# Patient Record
Sex: Female | Born: 1975 | Race: Black or African American | Hispanic: No | Marital: Married | Smoking: Never smoker
Health system: Southern US, Community
[De-identification: ages and names within clinical notes are randomized; demographics above are authoritative.]

## PROBLEM LIST (undated history)

## (undated) HISTORY — PX: DILATION AND CURETTAGE OF UTERUS: SHX78

---

## 2009-04-13 ENCOUNTER — Ambulatory Visit: Payer: Self-pay | Admitting: Family Medicine

## 2009-07-05 ENCOUNTER — Ambulatory Visit: Payer: Self-pay | Admitting: Unknown Physician Specialty

## 2010-08-02 ENCOUNTER — Ambulatory Visit: Payer: Self-pay | Admitting: Family Medicine

## 2012-06-17 ENCOUNTER — Ambulatory Visit: Payer: Self-pay | Admitting: Family Medicine

## 2012-12-31 ENCOUNTER — Emergency Department: Payer: Self-pay | Admitting: Emergency Medicine

## 2012-12-31 LAB — URINALYSIS, COMPLETE
Bilirubin,UR: NEGATIVE
Nitrite: NEGATIVE
Protein: NEGATIVE
RBC,UR: 1 /HPF (ref 0–5)
Specific Gravity: 1.017 (ref 1.003–1.030)
WBC UR: 1 /HPF (ref 0–5)

## 2012-12-31 LAB — WET PREP, GENITAL

## 2012-12-31 LAB — COMPREHENSIVE METABOLIC PANEL
Alkaline Phosphatase: 68 U/L (ref 50–136)
Anion Gap: 6 — ABNORMAL LOW (ref 7–16)
BUN: 9 mg/dL (ref 7–18)
Bilirubin,Total: 0.4 mg/dL (ref 0.2–1.0)
Calcium, Total: 9 mg/dL (ref 8.5–10.1)
Chloride: 103 mmol/L (ref 98–107)
Creatinine: 0.92 mg/dL (ref 0.60–1.30)
EGFR (African American): >60
EGFR (Non-African Amer.): >60
Glucose: 98 mg/dL (ref 65–99)
Osmolality: 271 (ref 275–301)
SGOT(AST): 20 U/L (ref 15–37)
Total Protein: 7.4 g/dL (ref 6.4–8.2)

## 2012-12-31 LAB — CBC
HCT: 41.3 % (ref 35.0–47.0)
HGB: 13.7 g/dL (ref 12.0–16.0)
MCH: 27.5 pg (ref 26.0–34.0)
MCHC: 33.3 g/dL (ref 32.0–36.0)
MCV: 83 fL (ref 80–100)
Platelet: 256 10*3/uL (ref 150–440)
RDW: 15.3 % — ABNORMAL HIGH (ref 11.5–14.5)
WBC: 7.4 10*3/uL (ref 3.6–11.0)

## 2012-12-31 LAB — GC/CHLAMYDIA PROBE AMP

## 2012-12-31 LAB — HCG, QUANTITATIVE, PREGNANCY: Beta Hcg, Quant.: 276 m[IU]/mL — ABNORMAL HIGH

## 2012-12-31 LAB — PREGNANCY, URINE: Pregnancy Test, Urine: POSITIVE m[IU]/mL

## 2013-08-13 ENCOUNTER — Ambulatory Visit: Payer: Self-pay | Admitting: Family Medicine

## 2015-01-13 ENCOUNTER — Other Ambulatory Visit: Payer: Self-pay | Admitting: Family Medicine

## 2015-01-13 DIAGNOSIS — Z1231 Encounter for screening mammogram for malignant neoplasm of breast: Secondary | ICD-10-CM

## 2015-01-26 ENCOUNTER — Ambulatory Visit
Admission: RE | Admit: 2015-01-26 | Discharge: 2015-01-26 | Disposition: A | Discharge status: Home / Self Care | Payer: 59 | Source: Ambulatory Visit | Attending: Family Medicine | Admitting: Family Medicine

## 2015-01-26 DIAGNOSIS — Z1231 Encounter for screening mammogram for malignant neoplasm of breast: Secondary | ICD-10-CM | POA: Diagnosis present

## 2016-02-15 ENCOUNTER — Other Ambulatory Visit: Payer: Self-pay | Admitting: Family Medicine

## 2016-02-15 DIAGNOSIS — Z1231 Encounter for screening mammogram for malignant neoplasm of breast: Secondary | ICD-10-CM

## 2016-02-27 ENCOUNTER — Ambulatory Visit
Admission: RE | Admit: 2016-02-27 | Discharge: 2016-02-27 | Disposition: A | Discharge status: Home / Self Care | Payer: 59 | Source: Ambulatory Visit | Attending: Family Medicine | Admitting: Family Medicine

## 2016-02-27 DIAGNOSIS — Z1231 Encounter for screening mammogram for malignant neoplasm of breast: Secondary | ICD-10-CM | POA: Insufficient documentation

## 2016-05-29 ENCOUNTER — Telehealth: Payer: Self-pay

## 2016-05-29 NOTE — Telephone Encounter (Signed)
Pt called stating she needed her recs for another doctor.  Adv to fill out ROI.

## 2016-12-24 ENCOUNTER — Encounter (HOSPITAL_COMMUNITY): Payer: Self-pay | Admitting: *Deleted

## 2017-02-01 ENCOUNTER — Other Ambulatory Visit: Payer: Self-pay | Admitting: Family Medicine

## 2017-02-01 DIAGNOSIS — Z1231 Encounter for screening mammogram for malignant neoplasm of breast: Secondary | ICD-10-CM

## 2017-02-28 ENCOUNTER — Ambulatory Visit
Admission: RE | Admit: 2017-02-28 | Discharge: 2017-02-28 | Disposition: A | Discharge status: Home / Self Care | Payer: 59 | Source: Ambulatory Visit | Attending: Family Medicine | Admitting: Family Medicine

## 2017-02-28 DIAGNOSIS — Z1231 Encounter for screening mammogram for malignant neoplasm of breast: Secondary | ICD-10-CM | POA: Diagnosis not present

## 2017-03-01 ENCOUNTER — Other Ambulatory Visit: Payer: Self-pay | Admitting: Family Medicine

## 2017-03-01 DIAGNOSIS — R928 Other abnormal and inconclusive findings on diagnostic imaging of breast: Secondary | ICD-10-CM

## 2017-03-07 ENCOUNTER — Ambulatory Visit
Admission: RE | Admit: 2017-03-07 | Discharge: 2017-03-07 | Disposition: A | Discharge status: Home / Self Care | Payer: 59 | Source: Ambulatory Visit | Attending: Family Medicine | Admitting: Family Medicine

## 2017-03-07 DIAGNOSIS — N6489 Other specified disorders of breast: Secondary | ICD-10-CM | POA: Insufficient documentation

## 2017-03-07 DIAGNOSIS — R928 Other abnormal and inconclusive findings on diagnostic imaging of breast: Secondary | ICD-10-CM

## 2017-03-07 DIAGNOSIS — N6002 Solitary cyst of left breast: Secondary | ICD-10-CM | POA: Diagnosis not present

## 2017-07-31 ENCOUNTER — Other Ambulatory Visit: Payer: Self-pay | Admitting: Family Medicine

## 2017-07-31 DIAGNOSIS — N6002 Solitary cyst of left breast: Secondary | ICD-10-CM

## 2017-11-25 ENCOUNTER — Ambulatory Visit
Admission: RE | Admit: 2017-11-25 | Discharge: 2017-11-25 | Disposition: A | Discharge status: Home / Self Care | Payer: 59 | Source: Ambulatory Visit | Attending: Family Medicine | Admitting: Family Medicine

## 2017-11-25 DIAGNOSIS — N6002 Solitary cyst of left breast: Secondary | ICD-10-CM | POA: Diagnosis not present

## 2018-10-21 ENCOUNTER — Telehealth: Payer: Self-pay

## 2018-10-21 NOTE — Telephone Encounter (Signed)
Pt calling after hour nurse this am c/o rash on the outer area of vagina that has spread down to the inner thigh area now; rash is itchy.  [phone]  Adv pt I couldn't adv her b/c we haven't seen her in three yrs.  She needs to be seen.  While tx pt to [facility] for scheduling, I accendentally hung up on her.  Information given to SP who called pt back and scheduled her for tomorrow c PH.

## 2018-10-22 ENCOUNTER — Ambulatory Visit (INDEPENDENT_AMBULATORY_CARE_PROVIDER_SITE_OTHER): Payer: 59 | Admitting: Obstetrics & Gynecology

## 2018-10-22 ENCOUNTER — Other Ambulatory Visit (HOSPITAL_COMMUNITY)
Admission: RE | Admit: 2018-10-22 | Discharge: 2018-10-22 | Disposition: A | Discharge status: Home / Self Care | Payer: 59 | Source: Ambulatory Visit | Attending: Obstetrics & Gynecology | Admitting: Obstetrics & Gynecology

## 2018-10-22 ENCOUNTER — Encounter: Payer: Self-pay | Admitting: Obstetrics & Gynecology

## 2018-10-22 ENCOUNTER — Other Ambulatory Visit: Payer: Self-pay

## 2018-10-22 VITALS — BP 120/80 | Ht 67.0 in | Wt 252.0 lb

## 2018-10-22 DIAGNOSIS — N898 Other specified noninflammatory disorders of vagina: Secondary | ICD-10-CM | POA: Diagnosis not present

## 2018-10-22 DIAGNOSIS — B3731 Acute candidiasis of vulva and vagina: Secondary | ICD-10-CM

## 2018-10-22 DIAGNOSIS — B373 Candidiasis of vulva and vagina: Secondary | ICD-10-CM

## 2018-10-22 MED ORDER — CLOTRIMAZOLE-BETAMETHASONE 1-0.05 % EX CREA: 1.0000 "application " | TOPICAL_CREAM | Freq: Two times a day (BID) | CUTANEOUS | 0 refills | Status: DC

## 2018-10-22 NOTE — Patient Instructions (Signed)
Betamethasone; Clotrimazole skin cream What is this medicine? BETAMETHASONE; CLOTRIMAZOLE (bay ta METH a sone; kloe TRIM a zole) is a corticosteroid and antifungal cream. It treats ringworm and infections like jock itch and athlete's foot. It also helps reduce swelling, redness, and itching caused by these infections. This medicine may be used for other purposes; ask your health care provider or pharmacist if you have questions. COMMON BRAND NAME(S): Lotrisone What should I tell my health care provider before I take this medicine? They need to know if you have any of these conditions:  large areas of burned or damaged skin  skin thinning  peripheral vascular disease or poor circulation  an unusual or allergic reaction to betamethasone, clotrimazole, other corticosteroids, other antifungals, other medicines, foods, dyes, or preservatives  pregnant or trying to get pregnant  breast-feeding How should I use this medicine? This cream is for external use only. Do not take by mouth. Follow the directions on the prescription label. Wash your hands before and after use. If treating hand or nail infections, wash hands before use only. Apply a thin layer of cream to the affected area and rub in gently. Do not cover or wrap the treated area with an airtight bandage (like a plastic bandage). Use the cream for the full course of treatment prescribed, even if you think the condition is getting better. Use the medicine at regular intervals. Do not use more often than directed. Do not use on healthy skin or over large areas of skin. Do not use this medicine for any condition other than the one for which it was prescribed. When applying to the groin area, apply a small amount and do not use for longer than 2 weeks unless directed to by your doctor or health care professional. Do not get this cream in your eyes. If you do, rinse out with plenty of cool tap water. Talk to your pediatrician regarding the use of  this medicine in children. While this drug may be prescribed for children as young as 17 years for selected conditions, precautions do apply. Patients over 55 years old may have a stronger reaction and need a smaller dose. Overdosage: If you think you have taken too much of this medicine contact a poison control center or emergency room at once. NOTE: This medicine is only for you. Do not share this medicine with others. What if I miss a dose? If you miss a dose, use it as soon as you can. If it is almost time for your next dose, use only that dose. Do not use double or take extra doses. What may interact with this medicine?  topical products that have nystatin This list may not describe all possible interactions. Give your health care provider a list of all the medicines, herbs, non-prescription drugs, or dietary supplements you use. Also tell them if you smoke, drink alcohol, or use illegal drugs. Some items may interact with your medicine. What should I watch for while using this medicine? If using this medicine on your body or groin tell your doctor or health care professional if your symptoms do not improve within 1 week. If using this medicine on your feet tell your doctor or health care professional if your symptoms do not improve within 2 weeks. Tell your doctor if your skin infection returns after you stop using this cream. If you are using this cream for 'jock itch' be sure to dry the groin completely after bathing. Do not wear underwear that is tight-fitting or  made from synthetic fibers like rayon or nylon. Wear loose-fitting, cotton underwear. If you are using this cream for athlete's foot be sure to dry your feet carefully after bathing, especially between the toes. Do not wear socks made from wool or synthetic materials like rayon or nylon. Wear clean cotton socks and change them at least once a day, change them more if your feet sweat a lot. Also, try to wear sandals or shoes that are  well-ventilated. Do not use this cream to treat diaper rash. What side effects may I notice from receiving this medicine? Side effects that you should report to your doctor or health care professional as soon as possible:  allergic reactions like skin rash, itching or hives, swelling of the face, lips, or tongue  dark red spots on the skin  lack of healing of skin condition  loss of feeling on skin  painful, red, pus-filled blisters in hair follicles  skin infection  sores or blisters that do not heal properly  thinning of the skin or sunburn Side effects that usually do not require medical attention (report to your doctor or health care professional if they continue or are bothersome):  dry or peeling skin  minor skin irritation, burning, or itching This list may not describe all possible side effects. Call your doctor for medical advice about side effects. You may report side effects to FDA at 1-800-FDA-1088. Where should I keep my medicine? Keep out of the reach of children. Store at room temperature between 15 and 30 degrees C ( 59 and 86 degrees F). Do not freeze. Throw away any unused medicine after the expiration date. NOTE: This sheet is a summary. It may not cover all possible information. If you have questions about this medicine, talk to your doctor, pharmacist, or health care provider.  2020 Elsevier/Gold Standard (2007-06-18 16:14:28)

## 2018-10-22 NOTE — Progress Notes (Signed)
HPI:      Susan Gardner is a 43 y.o. [phone] F, Patient's last menstrual period was 10/12/2018., presents today for a problem visit.  Susan Gardner complains of:  Vulvar concern:   This is a 43 y.o. old Caucasian/White female who presents for the evaluation of vulvar lesion(s). Susan Gardner describes the vulvar lesion(s) as an itchy raised fine rash with some swelling; itching is the worst of it.  Used Monistat externally for 2 days and seemed to help, then came back.  Has reg periods, started having sx's after recent period stopped.  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  No pain, bumps.  Also reports freq vaginal discharge without odor or other assoc sx;s.  No contraception used.  Has tried for pregnancy without success in recent times; OK if it happened.  Has 30 yo son.  Recent miscarriages.  Reg cycles.  PAP UTD.  PMHx: Susan Gardner  has no past medical history on file. Also,  has no past surgical history on file., family history includes Breast cancer (age of onset: 42) in her mother.,  reports that Susan Gardner has never smoked. Susan Gardner has never used smokeless tobacco. Susan Gardner reports current alcohol use. Susan Gardner reports that Susan Gardner does not use drugs.  Susan Gardner has a current medication list which includes the following prescription(s): clotrimazole-betamethasone. Also, has No Known Allergies.  Review of Systems  Constitutional: Negative for chills, fever and malaise/fatigue.  HENT: Negative for congestion, sinus pain and sore throat.   Eyes: Negative for blurred vision and pain.  Respiratory: Negative for cough and wheezing.   Cardiovascular: Negative for chest pain and leg swelling.  Gastrointestinal: Negative for abdominal pain, constipation, diarrhea, heartburn, nausea and vomiting.  Genitourinary: Negative for dysuria, frequency, hematuria and urgency.  Musculoskeletal: Negative for back pain, joint pain, myalgias and neck pain.  Skin: Negative for itching and rash.  Neurological: Negative for dizziness, tremors and  weakness.  Endo/Heme/Allergies: Does not bruise/bleed easily.  Psychiatric/Behavioral: Negative for depression. The patient is not nervous/anxious and does not have insomnia.     Objective: BP 120/80   Ht 5\' 7"  (1.702 m)   Wt 252 lb (114.3 kg)   LMP 10/12/2018   BMI 39.47 kg/m  Physical Exam Constitutional:      General: Susan Gardner is not in acute distress.    Appearance: Susan Gardner is well-developed.  Genitourinary:     Pelvic exam was performed with patient supine.     Vagina and uterus normal.     No vaginal erythema or bleeding.     No cervical motion tenderness, discharge, polyp or nabothian cyst.     Uterus is mobile.     Uterus is not enlarged.     No uterine mass detected.    Uterus is midaxial.     No right or left adnexal mass present.     Right adnexa not tender.     Left adnexa not tender.     Genitourinary Comments: Maculo papular eruption with scratching evidence No vesicles, ulcers  HENT:     Head: Normocephalic and atraumatic.     Nose: Nose normal.  Abdominal:     General: There is no distension.     Palpations: Abdomen is soft.     Tenderness: There is no abdominal tenderness.  Musculoskeletal: Normal range of motion.  Neurological:     Mental Status: Susan Gardner is alert and oriented to person, place, and time.     Cranial Nerves: No cranial nerve deficit.  Skin:  General: Skin is warm and dry.     ASSESSMENT/PLAN:  New onset vulvar concern  Problem List Items Addressed This Visit      Genitourinary   Candidal vulvitis - Primary   Relevant Medications   clotrimazole-betamethasone (LOTRISONE) cream    Other Visit Diagnoses    Vaginal discharge       Relevant Orders   Cervicovaginal ancillary only    Consider Diflucan for recurrence, also Terazol if internal  PAP at PCP 2019, UTD, never abnormal MMG 2019, UTD  Barnett Applebaum, MD, [facility], [facility] 10/22/2018  1:59 PM

## 2018-10-24 ENCOUNTER — Other Ambulatory Visit: Payer: Self-pay | Admitting: Obstetrics & Gynecology

## 2018-10-24 LAB — CERVICOVAGINAL ANCILLARY ONLY
Bacterial vaginitis: POSITIVE — AB
Candida vaginitis: NEGATIVE

## 2018-10-24 MED ORDER — METRONIDAZOLE 500 MG PO TABS: 500.0000 mg | ORAL_TABLET | Freq: Two times a day (BID) | ORAL | 0 refills | Status: DC

## 2018-10-24 NOTE — Progress Notes (Signed)
Pt aware.

## 2018-10-24 NOTE — Progress Notes (Signed)
Let her know culture pos for bacterial vaginitis and we will call in eRx for medicine for this (Flagyl twice daily for a week)

## 2018-12-03 ENCOUNTER — Encounter: Payer: Self-pay | Admitting: Obstetrics & Gynecology

## 2018-12-19 ENCOUNTER — Other Ambulatory Visit: Payer: Self-pay | Admitting: Family Medicine

## 2018-12-19 DIAGNOSIS — Z1231 Encounter for screening mammogram for malignant neoplasm of breast: Secondary | ICD-10-CM

## 2019-01-15 ENCOUNTER — Other Ambulatory Visit: Payer: Self-pay

## 2019-01-15 DIAGNOSIS — Z20822 Contact with and (suspected) exposure to covid-19: Secondary | ICD-10-CM

## 2019-01-17 LAB — NOVEL CORONAVIRUS, NAA: SARS-CoV-2, NAA: NOT DETECTED

## 2020-09-16 ENCOUNTER — Other Ambulatory Visit: Payer: Self-pay | Admitting: Family Medicine

## 2020-09-16 DIAGNOSIS — Z1231 Encounter for screening mammogram for malignant neoplasm of breast: Secondary | ICD-10-CM

## 2020-09-20 ENCOUNTER — Ambulatory Visit
Admission: RE | Admit: 2020-09-20 | Discharge: 2020-09-20 | Disposition: A | Discharge status: Home / Self Care | Payer: BC Managed Care – PPO | Source: Ambulatory Visit | Attending: Family Medicine | Admitting: Family Medicine

## 2020-09-20 ENCOUNTER — Other Ambulatory Visit: Payer: Self-pay

## 2020-09-20 DIAGNOSIS — Z1231 Encounter for screening mammogram for malignant neoplasm of breast: Secondary | ICD-10-CM | POA: Insufficient documentation

## 2020-11-02 ENCOUNTER — Ambulatory Visit: Payer: 59 | Admitting: Obstetrics & Gynecology

## 2020-11-24 ENCOUNTER — Ambulatory Visit: Payer: Self-pay | Admitting: Obstetrics & Gynecology

## 2020-11-25 ENCOUNTER — Other Ambulatory Visit (HOSPITAL_COMMUNITY)
Admission: RE | Admit: 2020-11-25 | Discharge: 2020-11-25 | Disposition: A | Discharge status: Home / Self Care | Payer: BC Managed Care – PPO | Source: Ambulatory Visit | Attending: Obstetrics & Gynecology | Admitting: Obstetrics & Gynecology

## 2020-11-25 ENCOUNTER — Ambulatory Visit (INDEPENDENT_AMBULATORY_CARE_PROVIDER_SITE_OTHER): Payer: BC Managed Care – PPO | Admitting: Obstetrics & Gynecology

## 2020-11-25 ENCOUNTER — Encounter: Payer: Self-pay | Admitting: Obstetrics & Gynecology

## 2020-11-25 ENCOUNTER — Other Ambulatory Visit: Payer: Self-pay

## 2020-11-25 VITALS — BP 120/80 | Ht 67.0 in | Wt 258.0 lb

## 2020-11-25 DIAGNOSIS — Z1211 Encounter for screening for malignant neoplasm of colon: Secondary | ICD-10-CM | POA: Diagnosis not present

## 2020-11-25 DIAGNOSIS — Z124 Encounter for screening for malignant neoplasm of cervix: Secondary | ICD-10-CM | POA: Insufficient documentation

## 2020-11-25 DIAGNOSIS — Z01419 Encounter for gynecological examination (general) (routine) without abnormal findings: Secondary | ICD-10-CM

## 2020-11-25 NOTE — Progress Notes (Signed)
HPI:      [name] is a 45 y.o. [phone] who LMP was Patient's last menstrual period was 10/31/2020., she presents today for her annual examination. The patient has no complaints today.  Still has reg cycles; no s/sx menopause. The patient is sexually active. Her last pap: was normal and last mammogram: approximate date 2022 and was normal. The patient does perform self breast exams.  There is no notable family history of breast or ovarian cancer in her family.  The patient has regular exercise: yes.  The patient denies current symptoms of depression.    GYN History: Contraception: none  PMHx: History reviewed. No pertinent past medical history. Past Surgical History:  Procedure Laterality Date   DILATION AND CURETTAGE OF UTERUS     Family History  Problem Relation Age of Onset   Breast cancer Mother 28   Social History   Tobacco Use   Smoking status: Never   Smokeless tobacco: Never  Vaping Use   Vaping Use: Never used  Substance Use Topics   Alcohol use: Yes   Drug use: Never   No current outpatient medications on file. Allergies: Patient has no known allergies.  Review of Systems  Constitutional:  Negative for chills, fever and malaise/fatigue.  HENT:  Negative for congestion, sinus pain and sore throat.   Eyes:  Negative for blurred vision and pain.  Respiratory:  Negative for cough and wheezing.   Cardiovascular:  Negative for chest pain and leg swelling.  Gastrointestinal:  Negative for abdominal pain, constipation, diarrhea, heartburn, nausea and vomiting.  Genitourinary:  Negative for dysuria, frequency, hematuria and urgency.  Musculoskeletal:  Negative for back pain, joint pain, myalgias and neck pain.  Skin:  Negative for itching and rash.  Neurological:  Negative for dizziness, tremors and weakness.  Endo/Heme/Allergies:  Does not bruise/bleed easily.  Psychiatric/Behavioral:  Negative for depression. The patient is not nervous/anxious and does not  have insomnia.   All other systems reviewed and are negative.  Objective: BP 120/80   Ht '5\' 7"'$  (1.702 m)   Wt 258 lb (117 kg)   LMP 10/31/2020   BMI 40.41 kg/m   Filed Weights   11/25/20 1438  Weight: 258 lb (117 kg)   Body mass index is 40.41 kg/m. Physical Exam Constitutional:      General: She is not in acute distress.    Appearance: She is well-developed.  Genitourinary:     Bladder, rectum and urethral meatus normal.     No lesions in the vagina.     Right Labia: No rash, tenderness or lesions.    Left Labia: No tenderness, lesions or rash.    No vaginal bleeding.      Right Adnexa: not tender and no mass present.    Left Adnexa: not tender and no mass present.    No cervical motion tenderness, friability, lesion or polyp.     Uterus is not enlarged.     No uterine mass detected.    Pelvic exam was performed with patient in the lithotomy position.  Breasts:    Right: No mass, skin change or tenderness.     Left: No mass, skin change or tenderness.  HENT:     Head: Normocephalic and atraumatic. No laceration.     Right Ear: Hearing normal.     Left Ear: Hearing normal.     Mouth/Throat:     Pharynx: Uvula midline.  Eyes:     Pupils: Pupils are equal, round,  and reactive to light.  Neck:     Thyroid: No thyromegaly.  Cardiovascular:     Rate and Rhythm: Normal rate and regular rhythm.     Heart sounds: No murmur heard.   No friction rub. No gallop.  Pulmonary:     Effort: Pulmonary effort is normal. No respiratory distress.     Breath sounds: Normal breath sounds. No wheezing.  Abdominal:     General: Bowel sounds are normal. There is no distension.     Palpations: Abdomen is soft.     Tenderness: There is no abdominal tenderness. There is no rebound.  Musculoskeletal:        General: Normal range of motion.     Cervical back: Normal range of motion and neck supple.  Neurological:     Mental Status: She is alert and oriented to person, place, and time.      Cranial Nerves: No cranial nerve deficit.  Skin:    General: Skin is warm and dry.  Psychiatric:        Judgment: Judgment normal.  Vitals reviewed.    Assessment:  ANNUAL EXAM 1. Women's annual routine gynecological examination   2. Screening for malignant neoplasm of cervix   3. Screen for colon cancer      Screening Plan:            1.  Cervical Screening-  Pap smear done today  2. Breast screening- Exam annually and mammogram>40 planned   3. Colonoscopy every 10 years, Hemoccult testing - after age 10  4. Labs managed by PCP  5. Counseling for contraception: no method, Infertility, will let it happen if happens    F/U  Return in about 1 year (around 11/25/2021) for Annual.  Barnett Applebaum, MD, [facility], [facility] 11/25/2020  3:11 PM

## 2020-11-25 NOTE — Patient Instructions (Signed)
PAP every three years Mammogram every year    Call [phone] to schedule at [facility] Colonoscopy every 10 years Labs yearly (with PCP)  Thank you for choosing [facility]. As part of our ongoing efforts to improve patient experience, we would appreciate your feedback. Please fill out the short survey that you will receive by mail or MyChart. Your opinion is important to [place]! - Dr. Kenton Kingfisher

## 2020-11-29 ENCOUNTER — Other Ambulatory Visit (INDEPENDENT_AMBULATORY_CARE_PROVIDER_SITE_OTHER): Payer: Self-pay

## 2020-11-29 DIAGNOSIS — Z1211 Encounter for screening for malignant neoplasm of colon: Secondary | ICD-10-CM

## 2020-11-29 LAB — CYTOLOGY - PAP
Comment: NEGATIVE
Diagnosis: NEGATIVE
High risk HPV: NEGATIVE

## 2020-11-29 MED ORDER — PEG 3350-KCL-NA BICARB-NACL 420 G PO SOLR: 4000.0000 mL | Freq: Once | ORAL | 0 refills | Status: AC

## 2020-11-29 NOTE — Progress Notes (Signed)
Gastroenterology Pre-Procedure Review  Request Date: 12/30/20 Requesting Physician: Dr. Bonna Gains  PATIENT REVIEW QUESTIONS: The patient responded to the following health history questions as indicated:    1. Are you having any GI issues? no 2. Do you have a personal history of Polyps? no 3. Do you have a family history of Colon Cancer or Polyps? yes (Mother colon polyps) 4. Diabetes Mellitus? no 5. Joint replacements in the past 12 months?no 6. Major health problems in the past 3 months?no 7. Any artificial heart valves, MVP, or defibrillator?no    MEDICATIONS & ALLERGIES:    Patient reports the following regarding taking any anticoagulation/antiplatelet therapy:   Plavix, Coumadin, Eliquis, Xarelto, Lovenox, Pradaxa, Brilinta, or Effient? no Aspirin? no  Patient confirms/reports the following medications:  Current Outpatient Medications  Medication Sig Dispense Refill   polyethylene glycol-electrolytes (NULYTELY) 420 g solution Take 4,000 mLs by mouth once for 1 dose. At 5 pm the evening before procedure: Drink an 8 oz glass every 20-30 minutes until completed. 4000 mL 0   Vitamin D, Ergocalciferol, (DRISDOL) 1.25 MG (50000 UNIT) CAPS capsule Take 50,000 Units by mouth once a week.     No current facility-administered medications for this visit.    Patient confirms/reports the following allergies:  No Known Allergies  No orders of the defined types were placed in this encounter.   AUTHORIZATION INFORMATION Primary Insurance: 1D#: Group #:  Secondary Insurance: 1D#: Group #:  SCHEDULE INFORMATION: Date: 12/30/20 Time: Location: [place]

## 2020-12-29 ENCOUNTER — Encounter: Payer: Self-pay | Admitting: Gastroenterology

## 2020-12-30 ENCOUNTER — Ambulatory Visit
Admission: RE | Admit: 2020-12-30 | Discharge: 2020-12-30 | Disposition: A | Discharge status: Home / Self Care | Payer: BC Managed Care – PPO | Attending: Gastroenterology | Admitting: Gastroenterology

## 2020-12-30 ENCOUNTER — Ambulatory Visit: Payer: BC Managed Care – PPO | Admitting: Anesthesiology

## 2020-12-30 ENCOUNTER — Encounter
Admission: RE | Disposition: A | Discharge status: Home / Self Care | Payer: Self-pay | Source: Home / Self Care | Attending: Gastroenterology

## 2020-12-30 DIAGNOSIS — Z1211 Encounter for screening for malignant neoplasm of colon: Secondary | ICD-10-CM | POA: Diagnosis present

## 2020-12-30 DIAGNOSIS — K635 Polyp of colon: Secondary | ICD-10-CM

## 2020-12-30 DIAGNOSIS — K5730 Diverticulosis of large intestine without perforation or abscess without bleeding: Secondary | ICD-10-CM | POA: Insufficient documentation

## 2020-12-30 DIAGNOSIS — D122 Benign neoplasm of ascending colon: Secondary | ICD-10-CM | POA: Insufficient documentation

## 2020-12-30 HISTORY — PX: COLONOSCOPY WITH PROPOFOL: SHX5780

## 2020-12-30 LAB — POCT PREGNANCY, URINE: Preg Test, Ur: NEGATIVE

## 2020-12-30 SURGERY — COLONOSCOPY WITH PROPOFOL
Anesthesia: General

## 2020-12-30 MED ORDER — PROPOFOL 10 MG/ML IV BOLUS
INTRAVENOUS | Status: DC | PRN
  Administered 2020-12-30: 50 mg via INTRAVENOUS
  Administered 2020-12-30: 30 mg via INTRAVENOUS
  Administered 2020-12-30: 50 mg via INTRAVENOUS
  Administered 2020-12-30: 150 mg via INTRAVENOUS

## 2020-12-30 MED ORDER — PROPOFOL 500 MG/50ML IV EMUL
INTRAVENOUS | Status: DC | PRN
  Administered 2020-12-30: 125 ug/kg/min via INTRAVENOUS

## 2020-12-30 MED ORDER — SODIUM CHLORIDE 0.9 % IV SOLN
INTRAVENOUS | Status: DC
  Administered 2020-12-30: 20 mL/h via INTRAVENOUS

## 2020-12-30 MED ORDER — PROPOFOL 500 MG/50ML IV EMUL
INTRAVENOUS | Status: AC
  Filled 2020-12-30: qty 50

## 2020-12-30 NOTE — Anesthesia Procedure Notes (Signed)
Date/Time: 12/30/2020 10:45 AM Performed by: Doreen Salvage, CRNA Pre-anesthesia Checklist: Patient identified, Emergency Drugs available, Suction available and Patient being monitored Patient Re-evaluated:Patient Re-evaluated prior to induction Oxygen Delivery Method: Supernova nasal CPAP Induction Type: IV induction Dental Injury: Teeth and Oropharynx as per pre-operative assessment  Comments: Nasal cannula with etCO2 monitoring

## 2020-12-30 NOTE — Anesthesia Postprocedure Evaluation (Signed)
Anesthesia Post Note  Patient: Susan Gardner  Procedure(s) Performed: COLONOSCOPY WITH PROPOFOL  Patient location during evaluation: Endoscopy Anesthesia Type: General Level of consciousness: awake and alert Pain management: pain level controlled Vital Signs Assessment: post-procedure vital signs reviewed and stable Respiratory status: spontaneous breathing, nonlabored ventilation and respiratory function stable Cardiovascular status: blood pressure returned to baseline and stable Postop Assessment: no apparent nausea or vomiting Anesthetic complications: no   No notable events documented.   Last Vitals:  Vitals:   12/30/20 1130 12/30/20 1140  BP: 116/72 126/85  Pulse: 80 83  Resp: 19 19  Temp:    SpO2: 100% 99%    Last Pain:  Vitals:   12/30/20 1140  TempSrc:   PainSc: 0-No pain                 [place] Ouch

## 2020-12-30 NOTE — Transfer of Care (Signed)
Immediate Anesthesia Transfer of Care Note  Patient: Susan Gardner  Procedure(s) Performed: COLONOSCOPY WITH PROPOFOL  Patient Location: PACU and Endoscopy Unit  Anesthesia Type:General  Level of Consciousness: awake, alert  and oriented  Airway & Oxygen Therapy: Patient Spontanous Breathing  Post-op Assessment: Report given to RN and Post -op Vital signs reviewed and stable  Post vital signs: Reviewed and stable  Last Vitals:  Vitals Value Taken Time  BP 112/77 12/30/20 1120  Temp 36.4 C 12/30/20 1120  Pulse 87 12/30/20 1121  Resp 16 12/30/20 1121  SpO2 100 % 12/30/20 1121  Vitals shown include unvalidated device data.  Last Pain:  Vitals:   12/30/20 1120  TempSrc: Temporal  PainSc: 0-No pain         Complications: No notable events documented.

## 2020-12-30 NOTE — Anesthesia Preprocedure Evaluation (Addendum)
Anesthesia Evaluation  Patient identified by MRN, date of birth, ID band Patient awake    Reviewed: Allergy & Precautions, NPO status , Patient's Chart, lab work & pertinent test results  Airway Mallampati: III  TM Distance: >3 FB Neck ROM: Full    Dental no notable dental hx.    Pulmonary neg pulmonary ROS,     + decreased breath sounds      Cardiovascular Exercise Tolerance: Good negative cardio ROS Normal cardiovascular exam     Neuro/Psych negative neurological ROS  negative psych ROS   GI/Hepatic negative GI ROS, Neg liver ROS,   Endo/Other  negative endocrine ROS  Renal/GU negative Renal ROS  negative genitourinary   Musculoskeletal negative musculoskeletal ROS (+)   Abdominal (+) + obese,   Peds negative pediatric ROS (+)  Hematology negative hematology ROS (+)   Anesthesia Other Findings Obesity  History reviewed. No pertinent past medical history.  Past Surgical History: No date: DILATION AND CURETTAGE OF UTERUS     Reproductive/Obstetrics negative OB ROS                            Anesthesia Physical Anesthesia Plan  ASA: 2  Anesthesia Plan: General   Post-op Pain Management:    Induction:   PONV Risk Score and Plan: Propofol infusion, TIVA and Treatment may vary due to age or medical condition  Airway Management Planned:   Additional Equipment:   Intra-op Plan:   Post-operative Plan:   Informed Consent: I have reviewed the patients History and Physical, chart, labs and discussed the procedure including the risks, benefits and alternatives for the proposed anesthesia with the patient or authorized representative who has indicated his/her understanding and acceptance.     Dental advisory given  Plan Discussed with: Anesthesiologist, CRNA and Surgeon  Anesthesia Plan Comments:        Anesthesia Quick Evaluation

## 2020-12-30 NOTE — H&P (Signed)
  Vonda Antigua, MD [address], [place], [place], [place], [address] [address], [place], [place], [place], [address] Phone: [phone]  Fax: [phone]  Primary Care Physician:  Marguerita Merles, MD   Pre-Procedure History & Physical: HPI:  Susan Gardner is a 45 y.o. female is here for a colonoscopy.   History reviewed. No pertinent past medical history.  Past Surgical History:  Procedure Laterality Date   DILATION AND CURETTAGE OF UTERUS      Prior to Admission medications   Medication Sig Start Date End Date Taking? Authorizing Provider  Vitamin D, Ergocalciferol, (DRISDOL) 1.25 MG (50000 UNIT) CAPS capsule Take 50,000 Units by mouth once a week. 09/15/20  Yes [provider]    Allergies as of 11/29/2020   (No Known Allergies)    Family History  Problem Relation Age of Onset   Breast cancer Mother 63    Social History   Socioeconomic History   Marital status: Married    Spouse name: Not on file   Number of children: Not on file   Years of education: Not on file   Highest education level: Not on file  Occupational History   Not on file  Tobacco Use   Smoking status: Never   Smokeless tobacco: Never  Vaping Use   Vaping Use: Never used  Substance and Sexual Activity   Alcohol use: Yes   Drug use: Never   Sexual activity: Yes    Birth control/protection: None  Other Topics Concern   Not on file  Social History Narrative   Not on file   Social Determinants of Health   Financial Resource Strain: Not on file  Food Insecurity: Not on file  Transportation Needs: Not on file  Physical Activity: Not on file  Stress: Not on file  Social Connections: Not on file  Intimate Partner Violence: Not on file    Review of Systems: See HPI, otherwise negative ROS  Physical Exam: Constitutional: General:   Alert,  Well-developed, well-nourished, pleasant and cooperative in NAD BP 131/83   Pulse 95   Temp 97.6 F (36.4 C) (Temporal)    Resp 20   Ht 5\' 7"  (1.702 m)   Wt 113.4 kg   SpO2 100%   BMI 39.16 kg/m   Head: Normocephalic, atraumatic.   Eyes:  Sclera clear, no icterus.   Conjunctiva pink.   Mouth:  No deformity or lesions, oropharynx pink & moist.  Neck:  Supple, trachea midline  Respiratory: Normal respiratory effort  Gastrointestinal:  Soft, non-tender and non-distended without masses, hepatosplenomegaly or hernias noted.  No guarding or rebound tenderness.     Cardiac: No clubbing or edema.  No cyanosis. Normal posterior tibial pedal pulses noted.  Lymphatic:  No significant cervical adenopathy.  Psych:  Alert and cooperative. Normal mood and affect.  Musculoskeletal:   Symmetrical without gross deformities. 5/5 Lower extremity strength bilaterally.  Skin: Warm. Intact without significant lesions or rashes. No jaundice.  Neurologic:  Face symmetrical, tongue midline, Normal sensation to touch;  grossly normal neurologically.  Psych:  Alert and oriented x3, Alert and cooperative. Normal mood and affect.  Impression/Plan: Susan Gardner is here for a colonoscopy to be performed for average risk screening.  Risks, benefits, limitations, and alternatives regarding  colonoscopy have been reviewed with the patient.  Questions have been answered.  All parties agreeable.   Virgel Manifold, MD  12/30/2020, 10:18 AM

## 2020-12-30 NOTE — Op Note (Signed)
[facility] Gastroenterology Patient Name: Susan Gardner Procedure Date: 12/30/2020 10:22 AM MRN: [id number] Account #: [id number] Date of Birth: July 15, 1975 Admit Type: Outpatient Age: 45 Room: [facility] ENDO ROOM 2 Gender: Female Note Status: Finalized Instrument Name: Jasper Riling [id number] Procedure:             Colonoscopy Indications:           Screening for colorectal malignant neoplasm Providers:             Grey Rakestraw B. Bonna Gains MD, MD Referring MD:          Marguerita Merles, MD (Referring MD) Medicines:             Monitored Anesthesia Care Complications:         No immediate complications. Procedure:             Pre-Anesthesia Assessment:                        - ASA Grade Assessment: II - A patient with mild                         systemic disease.                        - Prior to the procedure, a History and Physical was                         performed, and patient medications, allergies and                         sensitivities were reviewed. The patient's tolerance                         of previous anesthesia was reviewed.                        - The risks and benefits of the procedure and the                         sedation options and risks were discussed with the                         patient. All questions were answered and informed                         consent was obtained.                        - Patient identification and proposed procedure were                         verified prior to the procedure by the physician, the                         nurse, the anesthesiologist, the anesthetist and the                         technician. The procedure was verified in the  procedure room.                        After obtaining informed consent, the colonoscope was                         passed under direct vision. Throughout the procedure,                         the patient's blood pressure, pulse, and oxygen                          saturations were monitored continuously. The                         Colonoscope was introduced through the anus and                         advanced to the the cecum, identified by appendiceal                         orifice and ileocecal valve. The colonoscopy was                         performed with ease. The patient tolerated the                         procedure well. The quality of the bowel preparation                         was fair. Findings:      The perianal and digital rectal examinations were normal.      A 3 mm polyp was found in the ascending colon. The polyp was sessile.       The polyp was removed with a jumbo cold forceps. Resection and retrieval       were complete.      A 4 mm polyp was found in the sigmoid colon. The polyp was flat. The       polyp was removed with a jumbo cold forceps. Resection and retrieval       were complete.      A 6 mm polyp was found in the sigmoid colon. The polyp was sessile. The       polyp was removed with a cold snare. Resection and retrieval were       complete.      A 10 mm polyp was found in the sigmoid colon. The polyp was       pedunculated. The polyp was removed with a hot snare. Resection and       retrieval were complete. To prevent bleeding after the polypectomy, one       hemostatic clip was successfully placed. There was no bleeding at the       end of the procedure.      A few diverticula were found in the entire colon.      The exam was otherwise without abnormality.      The rectum, sigmoid colon, descending colon, transverse colon, ascending       colon and cecum appeared normal.      The retroflexed view of the distal rectum and anal verge was normal  and       showed no anal or rectal abnormalities. Impression:            - Preparation of the colon was fair.                        - One 3 mm polyp in the ascending colon, removed with                         a jumbo cold forceps. Resected and retrieved.                         - One 4 mm polyp in the sigmoid colon, removed with a                         jumbo cold forceps. Resected and retrieved.                        - One 6 mm polyp in the sigmoid colon, removed with a                         cold snare. Resected and retrieved.                        - One 10 mm polyp in the sigmoid colon, removed with a                         hot snare. Resected and retrieved. Clip was placed.                        - Diverticulosis in the entire examined colon.                        - The examination was otherwise normal.                        - The rectum, sigmoid colon, descending colon,                         transverse colon, ascending colon and cecum are normal.                        - The distal rectum and anal verge are normal on                         retroflexion view. Recommendation:        - Discharge patient to home (with escort).                        - Advance diet as tolerated.                        - Continue present medications.                        - Await pathology results.                        - Repeat colonoscopy date to be  determined after                         pending pathology results are reviewed.                        - The findings and recommendations were discussed with                         the patient.                        - The findings and recommendations were discussed with                         the patient's family.                        - Return to primary care physician as previously                         scheduled.                        - High fiber diet. Procedure Code(s):     --- Professional ---                        [phone], Colonoscopy, flexible; with removal of                         tumor(s), polyp(s), or other lesion(s) by snare                         technique                        45380, 69, Colonoscopy, flexible; with biopsy, single                         or  multiple Diagnosis Code(s):     --- Professional ---                        Z12.11, Encounter for screening for malignant neoplasm                         of colon                        K63.5, Polyp of colon CPT copyright 2019 [employer]. All rights reserved. The codes documented in this report are preliminary and upon coder review may  be revised to meet current compliance requirements.  Vonda Antigua, MD Margretta Sidle B. Bonna Gains MD, MD 12/30/2020 11:16:54 AM This report has been signed electronically. Number of Addenda: 0 Note Initiated On: 12/30/2020 10:22 AM Scope Withdrawal Time: 0 hours 17 minutes 54 seconds  Total Procedure Duration: 0 hours 27 minutes 26 seconds  Estimated Blood Loss:  Estimated blood loss: none.      [facility]

## 2021-01-02 ENCOUNTER — Encounter: Payer: Self-pay | Admitting: Gastroenterology

## 2021-01-02 LAB — SURGICAL PATHOLOGY

## 2021-01-05 ENCOUNTER — Encounter: Payer: Self-pay | Admitting: Gastroenterology

## 2021-10-06 ENCOUNTER — Other Ambulatory Visit: Payer: Self-pay | Admitting: Family Medicine

## 2021-10-06 DIAGNOSIS — Z1231 Encounter for screening mammogram for malignant neoplasm of breast: Secondary | ICD-10-CM

## 2021-10-11 ENCOUNTER — Ambulatory Visit
Admission: RE | Admit: 2021-10-11 | Discharge: 2021-10-11 | Disposition: A | Discharge status: Home / Self Care | Payer: BC Managed Care – PPO | Source: Ambulatory Visit | Attending: Family Medicine | Admitting: Family Medicine

## 2021-10-11 DIAGNOSIS — Z1231 Encounter for screening mammogram for malignant neoplasm of breast: Secondary | ICD-10-CM | POA: Diagnosis present

## 2022-01-23 ENCOUNTER — Emergency Department
Admission: EM | Admit: 2022-01-23 | Discharge: 2022-01-23 | Disposition: A | Discharge status: Home / Self Care | Payer: BC Managed Care – PPO | Attending: Emergency Medicine | Admitting: Emergency Medicine

## 2022-01-23 ENCOUNTER — Other Ambulatory Visit: Payer: Self-pay

## 2022-01-23 ENCOUNTER — Emergency Department: Payer: BC Managed Care – PPO

## 2022-01-23 DIAGNOSIS — M25571 Pain in right ankle and joints of right foot: Secondary | ICD-10-CM | POA: Diagnosis not present

## 2022-01-23 DIAGNOSIS — M79671 Pain in right foot: Secondary | ICD-10-CM | POA: Insufficient documentation

## 2022-01-23 MED ORDER — NAPROXEN 500 MG PO TABS: 500.0000 mg | ORAL_TABLET | Freq: Two times a day (BID) | ORAL | 2 refills | Status: AC

## 2022-01-23 MED ORDER — TRAMADOL HCL 50 MG PO TABS: 50.0000 mg | ORAL_TABLET | Freq: Four times a day (QID) | ORAL | 0 refills | Status: AC | PRN

## 2022-01-23 NOTE — ED Provider Notes (Signed)
   [facility] Provider Note    Event Date/Time   First MD Initiated Contact with Patient 01/23/22 [phone]     (approximate)   History   Foot Pain   HPI  Susan Gardner is a 46 y.o. female with no significant past medical history who presents with complaints of right ankle and heel pain which she describes as moderate to severe.  She took ibuprofen with little improvement.  This started yesterday.  She wonders whether it may have something to do with wearing heels.  No fever, no trauma reported     Physical Exam   Triage Vital Signs: ED Triage Vitals  Enc Vitals Group     BP 01/23/22 0733 139/82     Pulse Rate 01/23/22 0733 (!) 113     Resp 01/23/22 0733 18     Temp 01/23/22 0733 97.8 F (36.6 C)     Temp Source 01/23/22 0733 Oral     SpO2 01/23/22 0733 99 %     Weight 01/23/22 0735 113.4 kg (250 lb)     Height 01/23/22 0735 1.702 m ('5\' 7"'$ )     Head Circumference --      Peak Flow --      Pain Score 01/23/22 0736 6     Pain Loc --      Pain Edu? --      Excl. in [place]? --     Most recent vital signs: Vitals:   01/23/22 0733  BP: 139/82  Pulse: (!) 113  Resp: 18  Temp: 97.8 F (36.6 C)  SpO2: 99%     General: Awake, no distress.  CV:  Good peripheral perfusion.  Resp:  Normal effort.  Abd:  No distention.  Other:  Right foot: Mild tenderness along the lateral aspect of the right heel, ankle, minimal swelling, no bony abnormalities palpated   ED Results / Procedures / Treatments   Labs (all labs ordered are listed, but only abnormal results are displayed) Labs Reviewed - No data to display   EKG     RADIOLOGY Foot x-ray viewed interpret by me, no bony abnormality    PROCEDURES:  Critical Care performed:   Procedures   MEDICATIONS ORDERED IN ED: Medications - No data to display   IMPRESSION / MDM / [place] / ED COURSE  I reviewed the triage vital signs and the nursing notes. Patient's presentation is  most consistent with acute complicated illness / injury requiring diagnostic workup.   Patient presents with right foot pain as above.  Differential includes bone spur, sprain , contusion  X-ray to rule out bony injury  Recommend RICE, outpatient follow-up with podiatry as needed       FINAL CLINICAL IMPRESSION(S) / ED DIAGNOSES   Final diagnoses:  Foot pain, right     Rx / DC Orders   ED Discharge Orders          Ordered    naproxen (NAPROSYN) 500 MG tablet  2 times daily with meals        01/23/22 0809    traMADol (ULTRAM) 50 MG tablet  Every 6 hours PRN        01/23/22 0809             Note:  This document was prepared using Dragon voice recognition software and may include unintentional dictation errors.   Lavonia Drafts, MD 01/23/22 [phone]

## 2022-01-23 NOTE — ED Triage Notes (Signed)
Pt here with right foot pain that started yesterday. Pt denies injury but states she was wearing heels Sunday at church and thinks that may have attributed to the pain. Pt states outside of her foot is also sore. Pt had right hip pain but states it was relieved with 800 mg of ibuprofen.

## 2022-12-16 IMAGING — MG MM DIGITAL SCREENING BILAT W/ TOMO AND CAD
8 of 14 series · 8 of 40 positions shown · non-contrast
Comparison: Previous exam(s).

CLINICAL DATA: Screening.

EXAM:
DIGITAL SCREENING BILATERAL MAMMOGRAM WITH TOMOSYNTHESIS AND CAD
TECHNIQUE: Bilateral screening digital craniocaudal and mediolateral oblique
mammograms were obtained. Bilateral screening digital breast
tomosynthesis was performed. The images were evaluated with
computer-aided detection.

[L MLO synth-2D (1 of 2)]
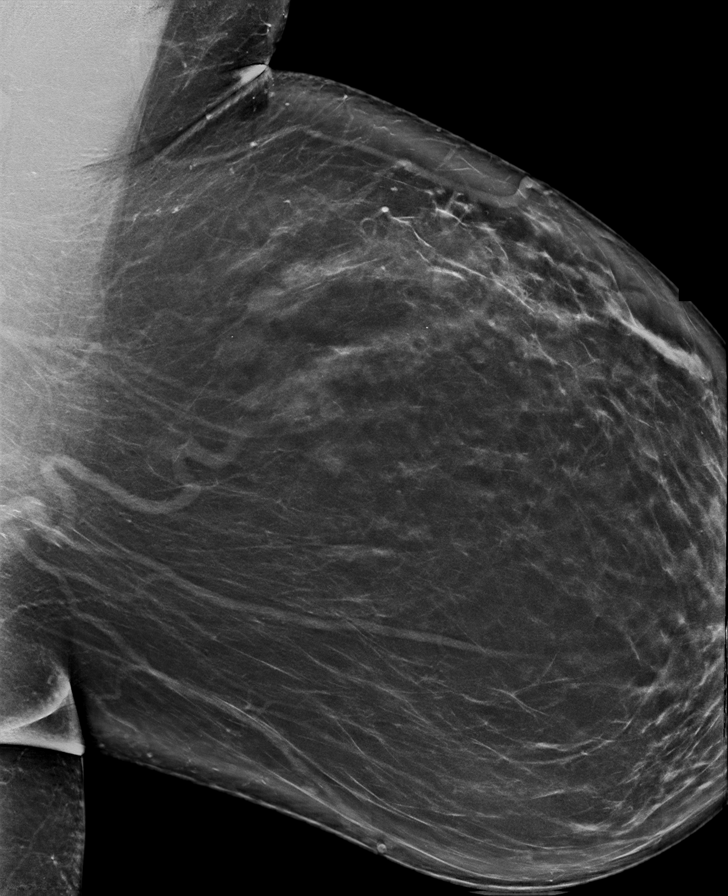

[L MLO synth-2D (2 of 2)]
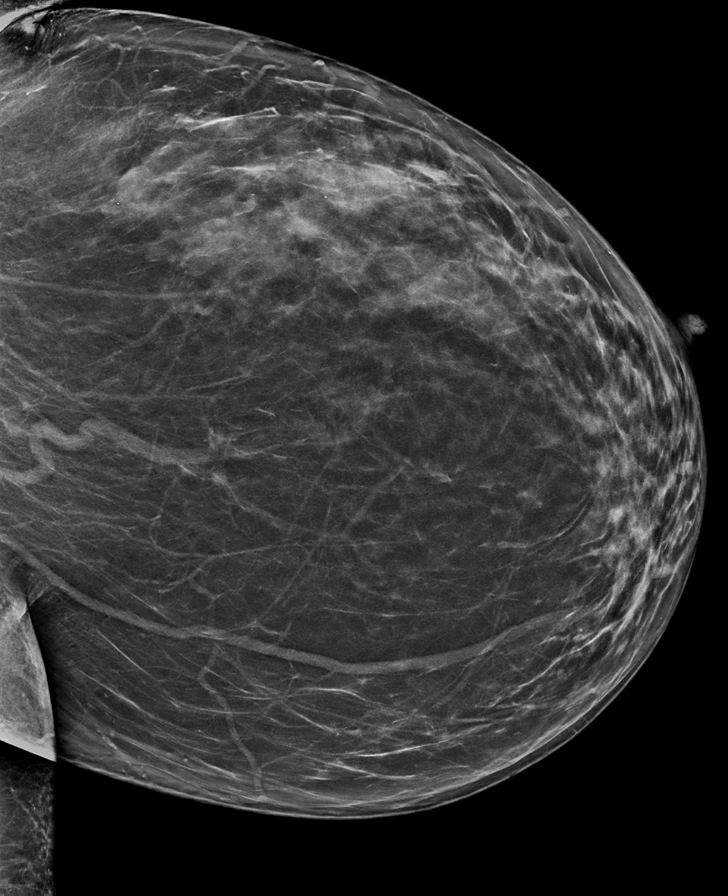

[R MLO synth-2D]
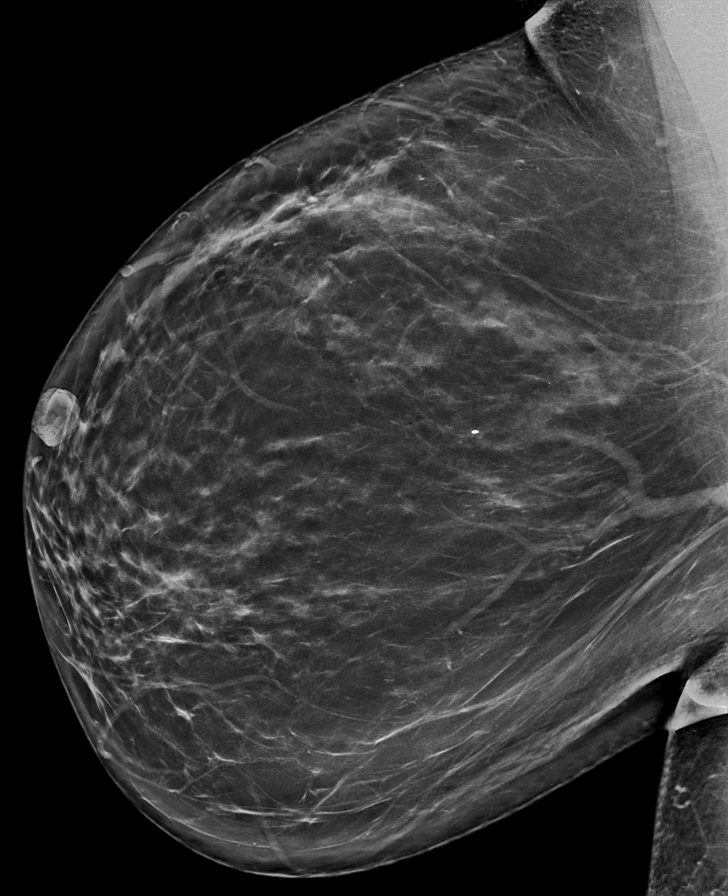

[R XCCL synth-2D]
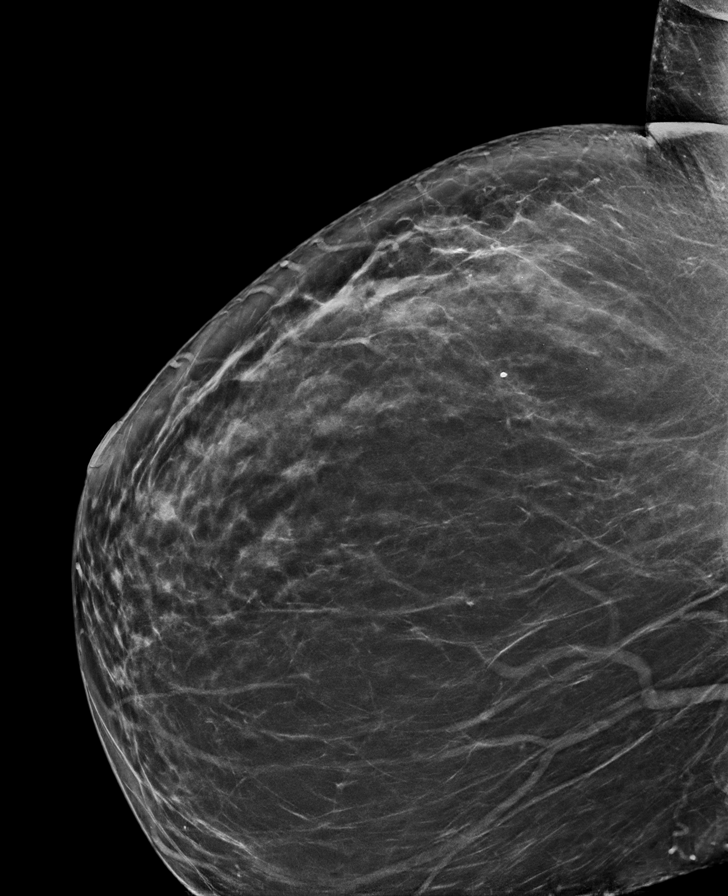

[L CC synth-2D (1 of 2)]
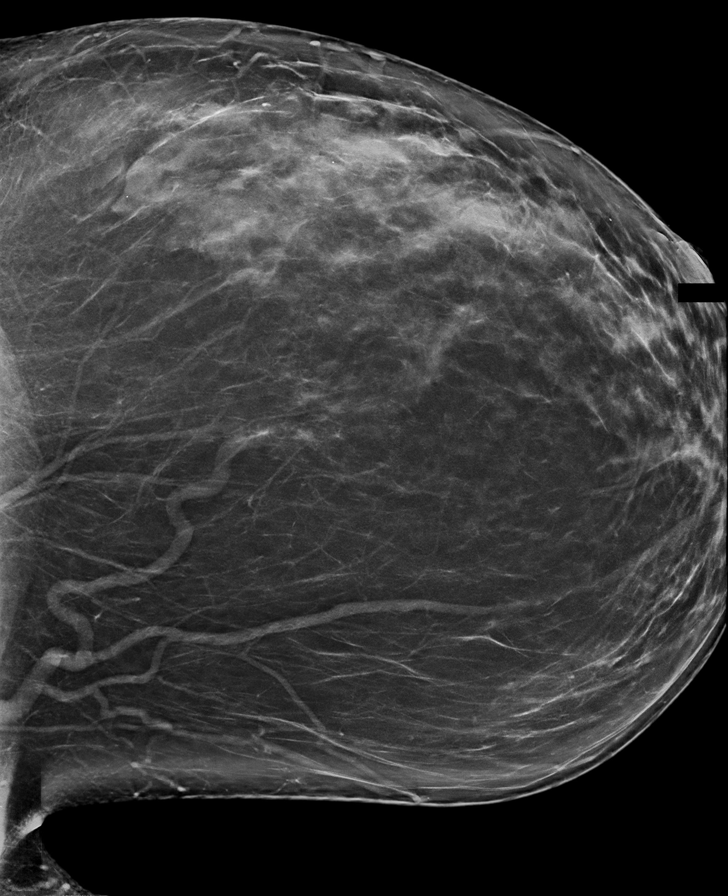

[R CC synth-2D]
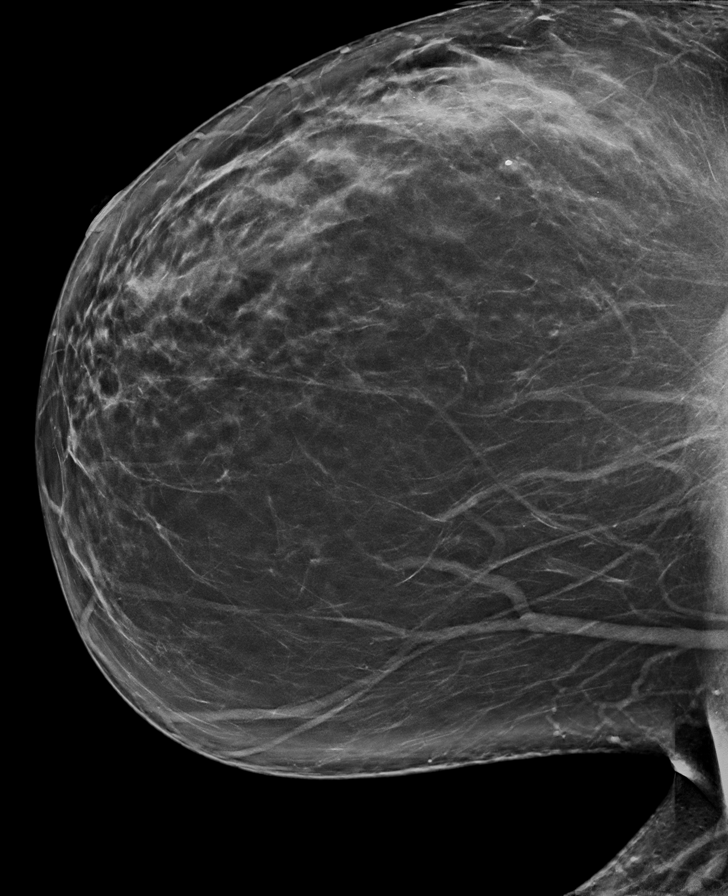

[L CC synth-2D (2 of 2)]
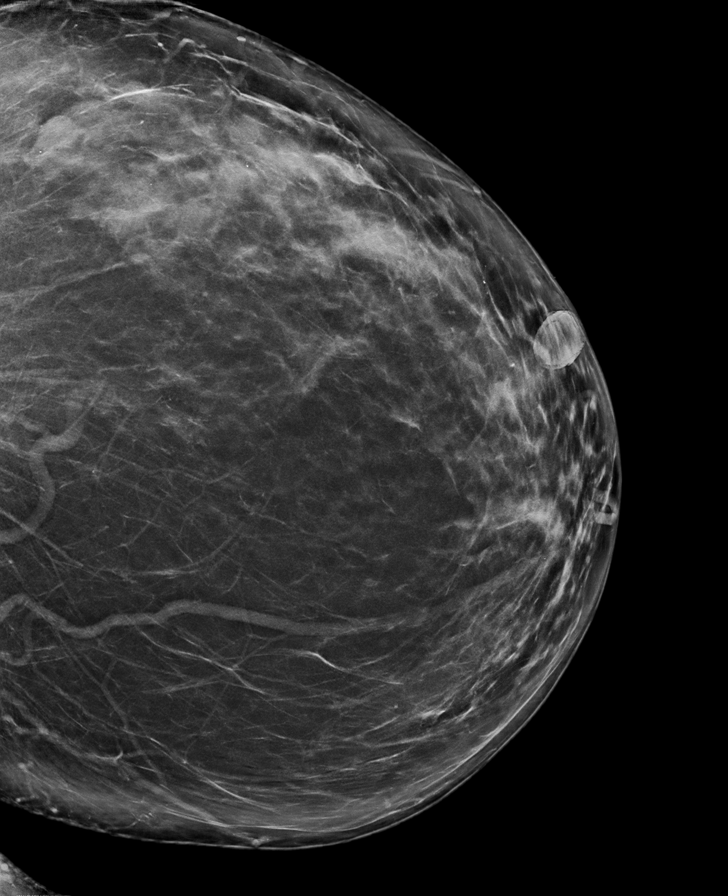

[R CC tomo · tomo slice 47/93.0]
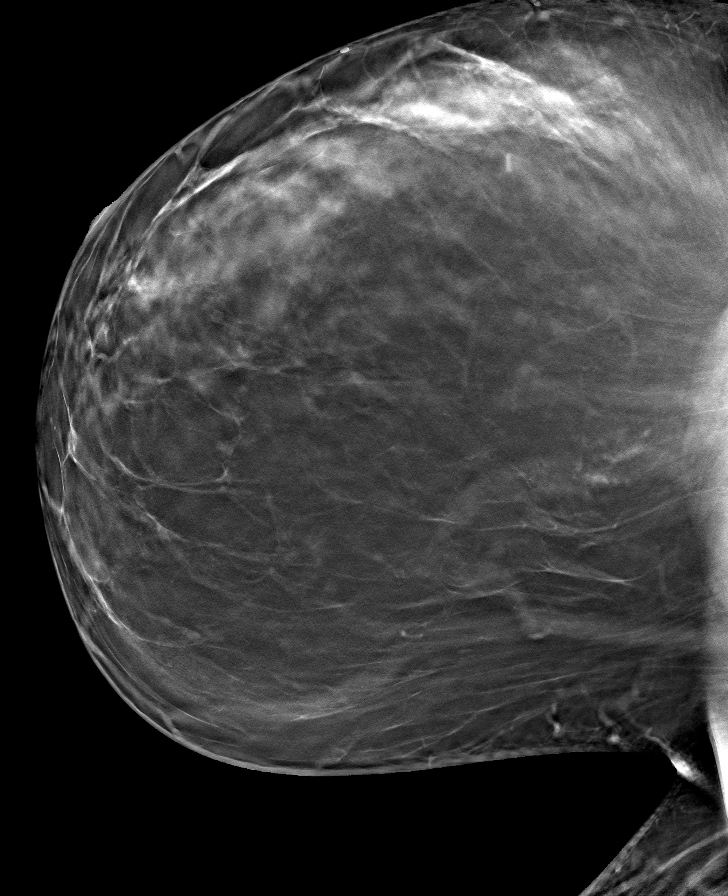

[8 of 40 positions shown; findings below may reference images not displayed]

ACR Breast Density Category b: There are scattered areas of
fibroglandular density.
FINDINGS: There are no findings suspicious for malignancy.
IMPRESSION: No mammographic evidence of malignancy. A result letter of this
screening mammogram will be mailed directly to the patient.

RECOMMENDATION:
Screening mammogram in one year. (Code:51-O-LD2)

BI-RADS CATEGORY  1: Negative.
